# Patient Record
Sex: Male | Born: 1990 | Race: White | Hispanic: No | Marital: Single | Smoking: Former smoker
Health system: Southern US, Community
[De-identification: ages and names within clinical notes are randomized; demographics above are authoritative.]

## PROBLEM LIST (undated history)

## (undated) DIAGNOSIS — F419 Anxiety disorder, unspecified: Secondary | ICD-10-CM

## (undated) DIAGNOSIS — F909 Attention-deficit hyperactivity disorder, unspecified type: Secondary | ICD-10-CM

## (undated) DIAGNOSIS — U071 COVID-19: Secondary | ICD-10-CM

---

## 2010-07-21 ENCOUNTER — Telehealth: Payer: Self-pay | Admitting: Family Medicine

## 2010-07-21 ENCOUNTER — Ambulatory Visit: Payer: Self-pay | Admitting: Family Medicine

## 2010-07-21 NOTE — Telephone Encounter (Signed)
Pt called in and needs his alapralozam 1 mg he takes twice daily.  However, pt had a NP appt today with Dr. Cathey Endow but she is out of the office sick.  Pt has to reschedule his new patient appt.  Told pt his only option to get his alprazolam med would be to go to the ED and hopefully they could give him a couple of pills til he gets back in with Dr. Cathey Endow.  Pt voiced understanding. Daniel Newcomer, LPN [facility]

## 2019-04-01 ENCOUNTER — Emergency Department
Admission: EM | Admit: 2019-04-01 | Discharge: 2019-04-01 | Disposition: A | Discharge status: Home / Self Care | Payer: Self-pay | Source: Home / Self Care | Attending: Family Medicine | Admitting: Family Medicine

## 2019-04-01 ENCOUNTER — Other Ambulatory Visit: Payer: Self-pay

## 2019-04-01 DIAGNOSIS — H5713 Ocular pain, bilateral: Secondary | ICD-10-CM

## 2019-04-01 NOTE — ED Provider Notes (Signed)
[name]    CSN: [id number] Arrival date & time: 04/01/19  1744      History   Chief Complaint Chief Complaint  Patient presents with  . Eye Problem    HPI Daniel Goodwin is a 29 y.o. male.   Patient complains of 4 to 5 day history of recurrent episodes of throbbing pain behind his right eye without changes in vision.  The episodes usually last about 20 to 30 minutes and resolve spontaneously.  All episodes have been in his right eye, but while driving to our clinic he developed similar pain in his left eye, now resolved.  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   He does not wear glasses.  The history is provided by the patient.  Eye Problem Location:  Both eyes Quality:  Pulsating Severity:  Mild Onset quality:  Gradual Duration:  5 days Timing:  Sporadic Progression:  Unchanged Chronicity:  New Context: not contact lens problem, not direct trauma, not foreign body, not using machinery, not scratch, not smoke exposure and not UV exposure   Relieved by:  Nothing Worsened by:  Nothing Ineffective treatments:  None tried Associated symptoms: no blurred vision, no crusting, no decreased vision, no discharge, no double vision, no facial rash, no headaches, no inflammation, no photophobia, no redness, no scotomas, no swelling and no tearing   Risk factors: no recent URI     History reviewed. No pertinent past medical history.  There are no problems to display for this patient.   History reviewed. No pertinent surgical history.     Home Medications    Prior to Admission medications   Medication Sig Start Date End Date Taking? Authorizing Provider  ALPRAZolam Duanne Moron) 1 MG tablet Take by mouth. 07/14/14  Yes [provider]  amphetamine-dextroamphetamine (ADDERALL XR) 10 MG 24 hr capsule Take by mouth.    [provider]    Family History History reviewed. No pertinent family history.  Social  History Social History   Tobacco Use  . Smoking status: Former Research scientist (life sciences)  . Smokeless tobacco: Never Used  Substance Use Topics  . Alcohol use: Not Currently  . Drug use: Not on file     Allergies   Penicillins   Review of Systems Review of Systems  Eyes: Negative for blurred vision, double vision, photophobia, discharge and redness.  Neurological: Negative for headaches.  All other systems reviewed and are negative.    Physical Exam Triage Vital Signs ED Triage Vitals [04/01/19 1807]  Enc Vitals Group     BP (!) 149/86     Pulse Rate 88     Resp 18     Temp 98.1 F (36.7 C)     Temp Source Oral     SpO2 96 %     Weight 208 lb (94.3 kg)     Height 6\' 3"  (1.905 m)     Head Circumference      Peak Flow      Pain Score 0     Pain Loc      Pain Edu?      Excl. in [place]?    No data found.  Updated Vital Signs BP (!) 149/86 (BP Location: Right Arm)   Pulse 88   Temp 98.1 F (36.7 C) (Oral)   Resp 18   Ht 6\' 3"  (1.905 m)   Wt 94.3 kg   SpO2 96%   BMI 26.00 kg/m  Visual Acuity Right Eye Distance:   Left Eye Distance:   Bilateral Distance:    Right Eye Near:   Left Eye Near:    Bilateral Near:     Physical Exam Vitals and nursing note reviewed.  Constitutional:      General: He is not in acute distress.    Appearance: Normal appearance.  HENT:     Head: Normocephalic.     Right Ear: Tympanic membrane, ear canal and external ear normal.     Left Ear: Tympanic membrane, ear canal and external ear normal.     Nose: Nose normal.     Mouth/Throat:     Mouth: Mucous membranes are moist.     Pharynx: Oropharynx is clear.  Eyes:     General: Lids are normal. Gaze aligned appropriately.        Right eye: No discharge or hordeolum.        Left eye: No discharge or hordeolum.     Extraocular Movements: Extraocular movements intact.     Conjunctiva/sclera: Conjunctivae normal.     Pupils: Pupils are equal, round, and reactive to light.     Comments: Fundi  benign.  Cardiovascular:     Rate and Rhythm: Normal rate.  Pulmonary:     Effort: Pulmonary effort is normal.  Lymphadenopathy:     Cervical: No cervical adenopathy.  Skin:    General: Skin is warm and dry.  Neurological:     Mental Status: He is alert.      UC Treatments / Results  Labs (all labs ordered are listed, but only abnormal results are displayed) Labs Reviewed - No data to display  EKG   Radiology No results found.  Procedures Procedures (including critical care time)  Medications Ordered in UC Medications - No data to display  Initial Impression / Assessment and Plan / UC Course  I have reviewed the triage vital signs and the nursing notes.  Pertinent labs & imaging results that were available during my care of the patient were reviewed by me and considered in my medical decision making (see chart for details).    Normal eye exam reassuring.  Cause of patient's pain is not apparent. Recommend follow-up with ophthalmologist for complete eye exam   Final Clinical Impressions(s) / UC Diagnoses   Final diagnoses:  Eye pain, bilateral   Discharge Instructions   None    ED Prescriptions    None        Lattie Haw, MD 04/02/19 1050

## 2019-04-01 NOTE — ED Triage Notes (Signed)
Pt c/o for the last 4-5 days, intermittent episodes of a throbbing pain in his RT eye. Lasts about 20-30 at a time, occurring about 3-4x a day.

## 2020-01-07 ENCOUNTER — Encounter: Payer: Self-pay | Admitting: Emergency Medicine

## 2020-01-07 ENCOUNTER — Emergency Department: Admit: 2020-01-07 | Discharge status: Absent without leave | Payer: Self-pay

## 2020-01-07 ENCOUNTER — Emergency Department
Admission: EM | Admit: 2020-01-07 | Discharge: 2020-01-07 | Disposition: A | Discharge status: Home / Self Care | Payer: BC Managed Care – PPO | Source: Home / Self Care | Attending: Internal Medicine | Admitting: Internal Medicine

## 2020-01-07 ENCOUNTER — Other Ambulatory Visit: Payer: Self-pay

## 2020-01-07 ENCOUNTER — Emergency Department (INDEPENDENT_AMBULATORY_CARE_PROVIDER_SITE_OTHER): Payer: BC Managed Care – PPO

## 2020-01-07 DIAGNOSIS — M79675 Pain in left toe(s): Secondary | ICD-10-CM | POA: Diagnosis not present

## 2020-01-07 DIAGNOSIS — S93505A Unspecified sprain of left lesser toe(s), initial encounter: Secondary | ICD-10-CM | POA: Diagnosis not present

## 2020-01-07 MED ORDER — IBUPROFEN 600 MG PO TABS: 600.0000 mg | ORAL_TABLET | Freq: Four times a day (QID) | ORAL | 0 refills | Status: DC | PRN

## 2020-01-07 NOTE — Discharge Instructions (Signed)
Rest, elevation, icing and gentle range of motion exercises Return to urgent care if symptoms worsen.

## 2020-01-07 NOTE — ED Triage Notes (Signed)
Left 4th toe injury running last night and ran into brick wall, immediate swelling and severe pain

## 2020-01-07 NOTE — ED Provider Notes (Signed)
[name]    CSN: [id number] Arrival date & time: 01/07/20  1002      History   Chief Complaint Chief Complaint  Patient presents with  . Toe Injury    HPI Daniel Goodwin is a 29 y.o. male comes to the urgent care with 1 day history of left fourth toe pain.  Pain is severe, throbbing, aggravated by putting weight on the left foot and no relieving factors.  Is associated with swelling and bruising of the fourth left toe.  Patient was running to get something for his child and accidentally hit her foot against the brick wall around the fireplace.  HPI  No past medical history on file.  There are no problems to display for this patient.   No past surgical history on file.     Home Medications    Prior to Admission medications   Medication Sig Start Date End Date Taking? Authorizing Provider  ALPRAZolam Prudy Feeler) 1 MG tablet Take by mouth. 07/14/14   [provider]  amphetamine-dextroamphetamine (ADDERALL XR) 10 MG 24 hr capsule Take by mouth.    [provider]  ibuprofen (ADVIL) 600 MG tablet Take 1 tablet (600 mg total) by mouth every 6 (six) hours as needed. 01/07/20   LampteyBritta Mccreedy, MD    Family History Family History  Problem Relation Age of Onset  . Cancer Father     Social History Social History   Tobacco Use  . Smoking status: Former Games developer  . Smokeless tobacco: Never Used  Vaping Use  . Vaping Use: Every day  . Devices: Vaping for 4-5 years  Substance Use Topics  . Alcohol use: Not Currently     Allergies   Penicillins   Review of Systems Review of Systems  Musculoskeletal: Positive for arthralgias and joint swelling.  Skin: Positive for color change. Negative for pallor and rash.     Physical Exam Triage Vital Signs ED Triage Vitals  Enc Vitals Group     BP 01/07/20 1027 118/81     Pulse Rate 01/07/20 1027 89     Resp 01/07/20 1027 18     Temp 01/07/20 1027 98.2 F (36.8 C)     Temp Source  01/07/20 1027 Oral     SpO2 01/07/20 1027 99 %     Weight 01/07/20 1029 220 lb (99.8 kg)     Height 01/07/20 1029 6\' 3"  (1.905 m)     Head Circumference --      Peak Flow --      Pain Score 01/07/20 1028 7     Pain Loc --      Pain Edu? --      Excl. in [place]? --    No data found.  Updated Vital Signs BP 118/81 (BP Location: Right Arm)   Pulse 89   Temp 98.2 F (36.8 C) (Oral)   Resp 18   Ht 6\' 3"  (1.905 m)   Wt 99.8 kg   SpO2 99%   BMI 27.50 kg/m   Visual Acuity Right Eye Distance:   Left Eye Distance:   Bilateral Distance:    Right Eye Near:   Left Eye Near:    Bilateral Near:     Physical Exam Vitals and nursing note reviewed.  Musculoskeletal:        General: Swelling, tenderness and deformity present.  Skin:    General: Skin is warm.     Coloration: Skin is not pale.     Findings:  Bruising present. No erythema.      UC Treatments / Results  Labs (all labs ordered are listed, but only abnormal results are displayed) Labs Reviewed - No data to display  EKG   Radiology DG Toe 4th Left  Result Date: 01/07/2020 CLINICAL DATA:  Pain after hitting toe against solid object EXAM: LEFT FOURTH TOE: 3 V COMPARISON:  None. FINDINGS: Frontal, oblique, and lateral views were obtained. There is no fracture or dislocation. Joint spaces appear normal. No erosive change. IMPRESSION: No fracture or dislocation.  No appreciable arthropathy. Electronically Signed   By: Bretta Bang III M.D.   On: 01/07/2020 11:11    Procedures Procedures (including critical care time)  Medications Ordered in UC Medications - No data to display  Initial Impression / Assessment and Plan / UC Course  I have reviewed the triage vital signs and the nursing notes.  Pertinent labs & imaging results that were available during my care of the patient were reviewed by me and considered in my medical decision making (see chart for details).     1.  Left fourth toe pain: X-ray of the  left foot is negative for acute fracture in the fourth toe of the left foot Ibuprofen 600 mg every 6 hours as needed for pain Rest, elevation, gentle range of motion exercises Return precautions given. Final Clinical Impressions(s) / UC Diagnoses   Final diagnoses:  Sprain of fourth toe of left foot, initial encounter     Discharge Instructions     Rest, elevation, icing and gentle range of motion exercises Return to urgent care if symptoms worsen.    ED Prescriptions    Medication Sig Dispense Auth. Provider   ibuprofen (ADVIL) 600 MG tablet Take 1 tablet (600 mg total) by mouth every 6 (six) hours as needed. 30 tablet Lennix Kneisel, Britta Mccreedy, MD     PDMP not reviewed this encounter.   Merrilee Jansky, MD 01/07/20 1114

## 2020-01-09 ENCOUNTER — Ambulatory Visit: Payer: BC Managed Care – PPO | Admitting: Family Medicine

## 2020-02-22 ENCOUNTER — Emergency Department
Admission: EM | Admit: 2020-02-22 | Discharge: 2020-02-22 | Disposition: A | Discharge status: Home / Self Care | Payer: BC Managed Care – PPO | Source: Home / Self Care

## 2020-02-22 ENCOUNTER — Other Ambulatory Visit: Payer: Self-pay

## 2020-02-22 DIAGNOSIS — J069 Acute upper respiratory infection, unspecified: Secondary | ICD-10-CM

## 2020-02-22 DIAGNOSIS — U071 COVID-19: Secondary | ICD-10-CM

## 2020-02-22 HISTORY — DX: Attention-deficit hyperactivity disorder, unspecified type: F90.9

## 2020-02-22 HISTORY — DX: Anxiety disorder, unspecified: F41.9

## 2020-02-22 HISTORY — DX: COVID-19: U07.1

## 2020-02-22 MED ORDER — IPRATROPIUM BROMIDE 0.03 % NA SOLN: 2.0000 | Freq: Three times a day (TID) | NASAL | 0 refills | Status: DC | PRN

## 2020-02-22 NOTE — ED Provider Notes (Signed)
[facility]    CSN: [id number] Arrival date & time: 02/22/20  1024      History   Chief Complaint Chief Complaint  Patient presents with  . Nasal Congestion    COVID positive    HPI Daniel Goodwin is a 30 y.o. male.   HPI Patient presents today for evaluation of nasal symptoms associated with a recent positive COVID-19 diagnosis x3 days ago.  Patient has a positive [facility] on 02/19/2020 for COVID-19.  Since that time he has had a common course of illness consisting of nasal congestion and sore throat.  Body aches and GI symptoms resolved following 24 hours after onset of symptoms.  He denies any shortness of breath. Past Medical History:  Diagnosis Date  . ADHD   . Anxiety   . COVID     There are no problems to display for this patient.   History reviewed. No pertinent surgical history.     Home Medications    Prior to Admission medications   Medication Sig Start Date End Date Taking? Authorizing Provider  acetaminophen (TYLENOL) 325 MG tablet Take 650 mg by mouth every 6 (six) hours as needed.   Yes [provider]  ALPRAZolam Prudy Feeler) 1 MG tablet Take by mouth. 07/14/14   [provider]  amphetamine-dextroamphetamine (ADDERALL XR) 10 MG 24 hr capsule Take by mouth.    [provider]  ibuprofen (ADVIL) 600 MG tablet Take 1 tablet (600 mg total) by mouth every 6 (six) hours as needed. 01/07/20   LampteyBritta Mccreedy, MD    Family History Family History  Problem Relation Age of Onset  . Healthy Mother   . Cancer Father   . Heart attack Father     Social History Social History   Tobacco Use  . Smoking status: Former Games developer  . Smokeless tobacco: Never Used  Vaping Use  . Vaping Use: Every day  . Devices: Vaping for 4-5 years  Substance Use Topics  . Alcohol use: Not Currently     Allergies   Penicillins   Review of Systems Review of Systems Pertinent negatives listed in HPI  Physical  Exam Triage Vital Signs ED Triage Vitals  Enc Vitals Group     BP 02/22/20 1049 (!) 142/84     Pulse Rate 02/22/20 1049 84     Resp 02/22/20 1049 18     Temp 02/22/20 1049 99.1 F (37.3 C)     Temp Source 02/22/20 1049 Oral     SpO2 02/22/20 1049 98 %     Weight 02/22/20 1044 220 lb (99.8 kg)     Height 02/22/20 1044 6\' 3"  (1.905 m)     Head Circumference --      Peak Flow --      Pain Score 02/22/20 1043 6     Pain Loc --      Pain Edu? --      Excl. in [place]? --    No data found.  Updated Vital Signs BP (!) 142/84 (BP Location: Right Arm)   Pulse 84   Temp 99.1 F (37.3 C) (Oral)   Resp 18   Ht 6\' 3"  (1.905 m)   Wt 220 lb (99.8 kg)   SpO2 98%   BMI 27.50 kg/m   Visual Acuity Right Eye Distance:   Left Eye Distance:   Bilateral Distance:    Right Eye Near:   Left Eye Near:    Bilateral Near:  Physical Exam  General Appearance:    Alert, acutely ill appearing, cooperative, no distress  HENT:   Normocephalic, ears normal, nares mucosal edema with congestion, rhinorrhea, oropharynx    Eyes:    PERRL, conjunctiva/corneas clear, EOM's intact       Lungs:     Clear to auscultation bilaterally, respirations unlabored  Heart:    Regular rate and rhythm  Neurologic:   Awake, alert, oriented x 3. No apparent focal neurological           defect.     UC Treatments / Results  Labs (all labs ordered are listed, but only abnormal results are displayed) Labs Reviewed - No data to display  EKG   Radiology No results found.  Procedures Procedures (including critical care time)  Medications Ordered in UC Medications - No data to display  Initial Impression / Assessment and Plan / UC Course  I have reviewed the triage vital signs and the nursing notes.  Pertinent labs & imaging results that were available during my care of the patient were reviewed by me and considered in my medical decision making (see chart for details).    Patient presents today with an  active COVID-19 infection with mild symptoms.  Reinforced the importance of symptomatic management only as symptoms are viral and will resolve with management of symptoms.  For nasal symptoms recommend antihistamine therapy along with prescribed Atrovent.  ER precautions given if patient develops any difficulty breathing or severe chest pain Final Clinical Impressions(s) / UC Diagnoses   Final diagnoses:  COVID-19 virus infection  Viral URI     Discharge Instructions     You are suffering from symptoms consistent with COVID-19 viral infection therefore antibiotics are not warranted for treatment and management of symptoms.  Symptom management is warranted recommend over-the-counter antihistamine such as Zyrtec or Xyzal    ED Prescriptions    Medication Sig Dispense Auth. Provider   ipratropium (ATROVENT) 0.03 % nasal spray Place 2 sprays into both nostrils 3 (three) times daily as needed for rhinitis. 30 mL Bing Neighbors, FNP     PDMP not reviewed this encounter.   Bing Neighbors, FNP 02/22/20 1344

## 2020-02-22 NOTE — ED Triage Notes (Addendum)
Pt presents to Urgent Care with c/o sore throat and nasal congestion w/ occasional blood in mucus x 4-5 days. Reports testing positive for COVID 3-4 days ago; fully vaccinated. Pt states he is concerned that he may have a sinus infection.

## 2020-02-22 NOTE — Discharge Instructions (Signed)
You are suffering from symptoms consistent with COVID-19 viral infection therefore antibiotics are not warranted for treatment and management of symptoms.  Symptom management is warranted recommend over-the-counter antihistamine such as Zyrtec or Xyzal

## 2020-02-26 ENCOUNTER — Telehealth: Payer: Self-pay | Admitting: Emergency Medicine

## 2020-02-26 NOTE — Telephone Encounter (Signed)
Patient called asking for medication for Covid. I explained its a virus and there is no medicine, to continue with Naproxen, warm salt water gargles, chloreseptic, throat lozenges, Mucinex, sudafed.

## 2020-08-15 ENCOUNTER — Emergency Department (INDEPENDENT_AMBULATORY_CARE_PROVIDER_SITE_OTHER)
Admission: EM | Admit: 2020-08-15 | Discharge: 2020-08-15 | Disposition: A | Discharge status: Home / Self Care | Payer: BC Managed Care – PPO | Source: Home / Self Care

## 2020-08-15 ENCOUNTER — Encounter: Payer: Self-pay | Admitting: Emergency Medicine

## 2020-08-15 ENCOUNTER — Other Ambulatory Visit: Payer: Self-pay

## 2020-08-15 DIAGNOSIS — J029 Acute pharyngitis, unspecified: Secondary | ICD-10-CM | POA: Diagnosis not present

## 2020-08-15 LAB — POCT RAPID STREP A (OFFICE): Rapid Strep A Screen: NEGATIVE

## 2020-08-15 MED ORDER — ALUM & MAG HYDROXIDE-SIMETH 200-200-20 MG/5ML PO SUSP
15.0000 mL | Freq: Once | ORAL | Status: AC
  Administered 2020-08-15: 15 mL via ORAL

## 2020-08-15 MED ORDER — SODIUM CHLORIDE 0.9 % IV SOLN: 40.0000 mg | Freq: Once | INTRAVENOUS | Status: DC

## 2020-08-15 MED ORDER — LIDOCAINE VISCOUS HCL 2 % MT SOLN
15.0000 mL | Freq: Once | OROMUCOSAL | Status: AC
  Administered 2020-08-15: 15 mL via OROMUCOSAL

## 2020-08-15 MED ORDER — FAMOTIDINE 40 MG PO TABS
40.0000 mg | ORAL_TABLET | Freq: Two times a day (BID) | ORAL | Status: DC
  Administered 2020-08-15: 40 mg via ORAL

## 2020-08-15 MED ORDER — FAMOTIDINE 20 MG PO TABS: 20.0000 mg | ORAL_TABLET | Freq: Two times a day (BID) | ORAL | 0 refills | Status: DC

## 2020-08-15 NOTE — Discharge Instructions (Addendum)
Take Pepcid twice daily.

## 2020-08-15 NOTE — ED Triage Notes (Signed)
Sore throat since Wednesday  Negative COVID test on Thursday at home  OTC tylenol 500mg  at 0940- min relief Feels like there is a stabbing sensation when he swallows  COVID in Jan or Feb 2022 COVID vaccine x 2

## 2020-08-15 NOTE — ED Provider Notes (Signed)
[facility]  ____________________________________________  Time seen: Approximately 11:55 AM  I have reviewed the triage vital signs and the nursing notes.   HISTORY  Chief Complaint Sore Throat and Gastroesophageal Reflux   Historian {Patient     HPI Daniel Goodwin is a 30 y.o. male resents to the urgent care with pharyngitis for the past 4 days.  Patient denies headache, rhinorrhea, nasal congestion or fever.  His at-home COVID-19 testing was negative.  No chest pain, chest tightness or abdominal pain.  Patient does have a history of acid reflux and is not currently taking any antacid medications.   Past Medical History:  Diagnosis Date   ADHD    Anxiety    COVID      Immunizations up to date:  Yes.     Past Medical History:  Diagnosis Date   ADHD    Anxiety    COVID     There are no problems to display for this patient.   History reviewed. No pertinent surgical history.  Prior to Admission medications   Medication Sig Start Date End Date Taking? Authorizing Provider  famotidine (PEPCID) 20 MG tablet Take 1 tablet (20 mg total) by mouth 2 (two) times daily. 08/15/20  Yes Pia Mau M, PA-C  acetaminophen (TYLENOL) 325 MG tablet Take 650 mg by mouth every 6 (six) hours as needed.    [provider]  ALPRAZolam Prudy Feeler) 1 MG tablet Take by mouth. 07/14/14   [provider]  amphetamine-dextroamphetamine (ADDERALL XR) 10 MG 24 hr capsule Take by mouth.    [provider]  ibuprofen (ADVIL) 600 MG tablet Take 1 tablet (600 mg total) by mouth every 6 (six) hours as needed. Patient not taking: Reported on 08/15/2020 01/07/20   Merrilee Jansky, MD  ipratropium (ATROVENT) 0.03 % nasal spray Place 2 sprays into both nostrils 3 (three) times daily as needed for rhinitis. Patient not taking: Reported on 08/15/2020 02/22/20   Bing Neighbors, FNP    Allergies Penicillins  Family History  Problem Relation Age of Onset    Healthy Mother    Cancer Father    Heart attack Father     Social History Social History   Tobacco Use   Smoking status: Former   Smokeless tobacco: Never  Building services engineer Use: Every day   Devices: Vaping for 4-5 years  Substance Use Topics   Alcohol use: Not Currently     Review of Systems  Constitutional: No fever/chills Eyes:  No discharge ENT: Patient has pharyngitis.  Respiratory: no cough. No SOB/ use of accessory muscles to breath Gastrointestinal:   No nausea, no vomiting.  No diarrhea.  No constipation. Musculoskeletal: Negative for musculoskeletal pain. Skin: Negative for rash, abrasions, lacerations, ecchymosis.    ____________________________________________   PHYSICAL EXAM:  VITAL SIGNS: ED Triage Vitals  Enc Vitals Group     BP 08/15/20 1141 127/83     Pulse Rate 08/15/20 1141 80     Resp 08/15/20 1141 16     Temp 08/15/20 1141 98.3 F (36.8 C)     Temp Source 08/15/20 1141 Oral     SpO2 08/15/20 1141 98 %     Weight --      Height --      Head Circumference --      Peak Flow --      Pain Score 08/15/20 1144 7     Pain Loc --      Pain Edu? --  Excl. in [place]? --      Constitutional: Alert and oriented. Well appearing and in no acute distress. Eyes: Conjunctivae are normal. PERRL. EOMI. Head: Atraumatic. ENT:      Ears:       Nose: No congestion/rhinnorhea.      Mouth/Throat: Mucous membranes are moist.  Posterior pharynx is mildly erythematous. Neck: No stridor.  No cervical spine tenderness to palpation. Cardiovascular: Normal rate, regular rhythm. Normal S1 and S2.  Good peripheral circulation. Respiratory: Normal respiratory effort without tachypnea or retractions. Lungs CTAB. Good air entry to the bases with no decreased or absent breath sounds Gastrointestinal: Bowel sounds x 4 quadrants. Soft and nontender to palpation. No guarding or rigidity. No distention. Musculoskeletal: Full range of motion to all extremities. No  obvious deformities noted Neurologic:  Normal for age. No gross focal neurologic deficits are appreciated.  Skin:  Skin is warm, dry and intact. No rash noted. Psychiatric: Mood and affect are normal for age. Speech and behavior are normal.   ____________________________________________   LABS (all labs ordered are listed, but only abnormal results are displayed)  Labs Reviewed  CULTURE, GROUP A STREP  POCT RAPID STREP A (OFFICE)   ____________________________________________  EKG   ____________________________________________  RADIOLOGY   No results found.  ____________________________________________    PROCEDURES  Procedure(s) performed:     Procedures     Medications  famotidine (PEPCID) tablet 40 mg (40 mg Oral Given 08/15/20 1157)  alum & mag hydroxide-simeth (MAALOX/MYLANTA) 200-200-20 MG/5ML suspension 15 mL (15 mLs Oral Given 08/15/20 1158)  lidocaine (XYLOCAINE) 2 % viscous mouth solution 15 mL (15 mLs Mouth/Throat Given 08/15/20 1158)     ____________________________________________   INITIAL IMPRESSION / ASSESSMENT AND PLAN / ED COURSE  Pertinent labs & imaging results that were available during my care of the patient were reviewed by me and considered in my medical decision making (see chart for details).    Assessment and plan Pharyngitis 30 year old male presents to the urgent care with pharyngitis for the past 4 days.  Vital signs are reassuring at triage.  On physical exam, patient had tonsillar atrophy with some cobblestoning.  No tonsillar hypertrophy or exudate.  Patient tested negative for group A strep.  Strep culture is in process.  Patient was given GI cocktail and Pepcid as I suspect a component of acid reflux.  Patient was advised to continue taking Pepcid daily.  Also recommended Tylenol for pharyngitis.  Return precautions were given to return with new or worsening  symptoms.      ____________________________________________  FINAL CLINICAL IMPRESSION(S) / ED DIAGNOSES  Final diagnoses:  Pharyngitis, unspecified etiology  Acute pharyngitis, unspecified etiology      NEW MEDICATIONS STARTED DURING THIS VISIT:  ED Discharge Orders          Ordered    famotidine (PEPCID) 20 MG tablet  2 times daily        08/15/20 1206                This chart was dictated using voice recognition software/Dragon. Despite best efforts to proofread, errors can occur which can change the meaning. Any change was purely unintentional.     Orvil Feil, PA-C 08/15/20 1402

## 2020-08-19 LAB — CULTURE, GROUP A STREP: Strep A Culture: NEGATIVE

## 2020-09-04 ENCOUNTER — Emergency Department: Admit: 2020-09-04 | Discharge status: Absent without leave | Payer: Self-pay

## 2020-09-04 ENCOUNTER — Emergency Department
Admission: RE | Admit: 2020-09-04 | Discharge: 2020-09-04 | Disposition: A | Discharge status: Home / Self Care | Payer: BC Managed Care – PPO | Source: Ambulatory Visit

## 2020-09-04 ENCOUNTER — Other Ambulatory Visit: Payer: Self-pay

## 2020-09-04 VITALS — BP 124/83 | HR 78 | Temp 99.2°F | Resp 16 | Ht 75.0 in | Wt 220.0 lb

## 2020-09-04 DIAGNOSIS — S93401A Sprain of unspecified ligament of right ankle, initial encounter: Secondary | ICD-10-CM

## 2020-09-04 NOTE — Discharge Instructions (Addendum)
Instructed patient to RICE right ankle for 20 minutes 2-3 times daily for the next 3 days.  Advised patient may take OTC ibuprofen 600 mg 1-2 times daily, as needed.  Advised/encouraged patient to rest right ankle for the next 5 to 7 days before repeating walking exercises.

## 2020-09-04 NOTE — ED Triage Notes (Signed)
Pain & swelling to R ankle x 1 week  Per pt he is normally sedentary  Pt is concerned about an injury -pain goes from ankle to inside of right lower leg Pt states he went for a 4 mile walk prior to pain starting Trace edema noted to bilat ankles Pt has iced & elevated R ankle  No new meds  Tylenol OTC

## 2020-09-04 NOTE — ED Provider Notes (Signed)
[facility]    CSN: [id number] Arrival date & time: 09/04/20  1341      History   Chief Complaint Chief Complaint  Patient presents with   Ankle Pain    Right     HPI Daniel Goodwin is a 30 y.o. male.   HPI 30 year old male presents with right ankle pain and swelling for 1 week.  Reports he is normally sedentary; however, recently went for a 4 mile walk afterwards noting trace edema and right ankle pain.  Past Medical History:  Diagnosis Date   ADHD    Anxiety    COVID     There are no problems to display for this patient.   History reviewed. No pertinent surgical history.     Home Medications    Prior to Admission medications   Medication Sig Start Date End Date Taking? Authorizing Provider  acetaminophen (TYLENOL) 325 MG tablet Take 650 mg by mouth every 6 (six) hours as needed.    [provider]  ALPRAZolam Prudy Feeler) 1 MG tablet Take by mouth. 07/14/14   [provider]  amphetamine-dextroamphetamine (ADDERALL XR) 10 MG 24 hr capsule Take by mouth.    [provider]  famotidine (PEPCID) 20 MG tablet Take 1 tablet (20 mg total) by mouth 2 (two) times daily. 08/15/20   Orvil Feil, PA-C  ibuprofen (ADVIL) 600 MG tablet Take 1 tablet (600 mg total) by mouth every 6 (six) hours as needed. Patient not taking: Reported on 08/15/2020 01/07/20   Merrilee Jansky, MD  ipratropium (ATROVENT) 0.03 % nasal spray Place 2 sprays into both nostrils 3 (three) times daily as needed for rhinitis. Patient not taking: Reported on 08/15/2020 02/22/20   Bing Neighbors, FNP    Family History Family History  Problem Relation Age of Onset   Healthy Mother    Cancer Father    Heart attack Father     Social History Social History   Tobacco Use   Smoking status: Former   Smokeless tobacco: Never  Building services engineer Use: Every day   Substances: Nicotine   Devices: Vaping for 4-5 years  Substance Use Topics   Alcohol use: Not  Currently   Drug use: Not Currently     Allergies   Penicillins   Review of Systems Review of Systems  Musculoskeletal:        Right ankle pain x1 week    Physical Exam Triage Vital Signs ED Triage Vitals  Enc Vitals Group     BP 09/04/20 1417 124/83     Pulse Rate 09/04/20 1417 78     Resp 09/04/20 1417 16     Temp 09/04/20 1417 99.2 F (37.3 C)     Temp Source 09/04/20 1417 Oral     SpO2 09/04/20 1417 98 %     Weight 09/04/20 1419 220 lb 0.3 oz (99.8 kg)     Height 09/04/20 1419 6\' 3"  (1.905 m)     Head Circumference --      Peak Flow --      Pain Score 09/04/20 1418 1     Pain Loc --      Pain Edu? --      Excl. in GC? --    No data found.  Updated Vital Signs BP 124/83 (BP Location: Right Arm)   Pulse 78   Temp 99.2 F (37.3 C) (Oral)   Resp 16   Ht 6\' 3"  (1.905 m)   Wt  220 lb 0.3 oz (99.8 kg)   SpO2 98%   BMI 27.50 kg/m   Physical Exam Vitals and nursing note reviewed.  Constitutional:      General: He is not in acute distress.    Appearance: Normal appearance. He is normal weight. He is not ill-appearing.  HENT:     Head: Normocephalic and atraumatic.     Mouth/Throat:     Mouth: Mucous membranes are moist.     Pharynx: Oropharynx is clear.  Eyes:     Extraocular Movements: Extraocular movements intact.     Conjunctiva/sclera: Conjunctivae normal.     Pupils: Pupils are equal, round, and reactive to light.  Cardiovascular:     Rate and Rhythm: Normal rate and regular rhythm.     Pulses: Normal pulses.     Heart sounds: Normal heart sounds.  Pulmonary:     Effort: Pulmonary effort is normal.     Breath sounds: Normal breath sounds. No wheezing, rhonchi or rales.  Musculoskeletal:        General: No swelling, tenderness or deformity. Normal range of motion.     Cervical back: Normal range of motion and neck supple. No tenderness.     Comments: Right ankle: Non-TTP over medial/lateral malleolus, FROM flexion/extension, performing ankle  circles, no deformity noted  Lymphadenopathy:     Cervical: No cervical adenopathy.  Skin:    General: Skin is warm and dry.     Findings: No bruising, erythema, lesion or rash.  Neurological:     General: No focal deficit present.     Mental Status: He is alert and oriented to person, place, and time. Mental status is at baseline.  Psychiatric:        Mood and Affect: Mood normal.        Behavior: Behavior normal.     UC Treatments / Results  Labs (all labs ordered are listed, but only abnormal results are displayed) Labs Reviewed - No data to display  EKG   Radiology No results found.  Procedures Procedures (including critical care time)  Medications Ordered in UC Medications - No data to display  Initial Impression / Assessment and Plan / UC Course  I have reviewed the triage vital signs and the nursing notes.  Pertinent labs & imaging results that were available during my care of the patient were reviewed by me and considered in my medical decision making (see chart for details).     MDM: 1.  Sprain of right ankle, unspecified ligament initial encounter-nstructed patient to RICE right ankle for 20 minutes 2-3 times daily for the next 3 days.  Advised patient may take OTC Ibuprofen 600 mg 1-2 times daily, as needed.  Advised/encouraged patient to rest right ankle for the next 5 to 7 days before repeating walking exercises. Final Clinical Impressions(s) / UC Diagnoses   Final diagnoses:  Sprain of right ankle, unspecified ligament, initial encounter     Discharge Instructions      Instructed patient to RICE right ankle for 20 minutes 2-3 times daily for the next 3 days.  Advised patient may take OTC ibuprofen 600 mg 1-2 times daily, as needed.  Advised/encouraged patient to rest right ankle for the next 5 to 7 days before repeating walking exercises.     ED Prescriptions   None    PDMP not reviewed this encounter.   Trevor Iha, FNP 09/04/20 1516

## 2020-09-12 ENCOUNTER — Encounter: Payer: Self-pay | Admitting: Emergency Medicine

## 2020-09-12 ENCOUNTER — Emergency Department (INDEPENDENT_AMBULATORY_CARE_PROVIDER_SITE_OTHER)
Admission: EM | Admit: 2020-09-12 | Discharge: 2020-09-12 | Disposition: A | Discharge status: Home / Self Care | Payer: BC Managed Care – PPO | Source: Home / Self Care | Attending: Family Medicine | Admitting: Family Medicine

## 2020-09-12 ENCOUNTER — Other Ambulatory Visit: Payer: Self-pay

## 2020-09-12 DIAGNOSIS — H5789 Other specified disorders of eye and adnexa: Secondary | ICD-10-CM

## 2020-09-12 MED ORDER — PREDNISONE 20 MG PO TABS: 20.0000 mg | ORAL_TABLET | Freq: Two times a day (BID) | ORAL | 0 refills | Status: DC

## 2020-09-12 NOTE — ED Triage Notes (Signed)
Patient states that he awoke this morning after doing yard work yesterday to the bottom of his right eye swollen.  No visual disturbance, no itching, some stinging right up under eye.  Patient denies any OTC meds.

## 2020-09-12 NOTE — Discharge Instructions (Addendum)
Use cool compresses to eye Take antihistamines like Claritin or Zyrtec, may take Benadryl at night Take the prednisone 2 times a day, take 2 doses today.  Take prednisone until reaction clears, likely 2 to 3 days Return as needed

## 2020-09-13 NOTE — ED Provider Notes (Signed)
[facility]    CSN: [id number] Arrival date & time: 09/12/20  8676      History   Chief Complaint Chief Complaint  Patient presents with   Eye Problem    HPI Kalum Minner is a 30 y.o. male.   HPI  Patient states he did some yard work yesterday.  He does not remember anything flying into his right eye, or any irritation of his eye.  Woke up with swelling and redness.  It is itching.  No tearing or discharge.  No redness of the conjunctive a.  No trouble with vision  Past Medical History:  Diagnosis Date   ADHD    Anxiety    COVID     There are no problems to display for this patient.   History reviewed. No pertinent surgical history.     Home Medications    Prior to Admission medications   Medication Sig Start Date End Date Taking? Authorizing Provider  acetaminophen (TYLENOL) 325 MG tablet Take 650 mg by mouth every 6 (six) hours as needed.   Yes [provider]  ALPRAZolam Prudy Feeler) 1 MG tablet Take by mouth. 07/14/14  Yes [provider]  amphetamine-dextroamphetamine (ADDERALL XR) 10 MG 24 hr capsule Take by mouth.   Yes [provider]  famotidine (PEPCID) 20 MG tablet Take 1 tablet (20 mg total) by mouth 2 (two) times daily. 08/15/20  Yes Pia Mau M, PA-C  predniSONE (DELTASONE) 20 MG tablet Take 1 tablet (20 mg total) by mouth 2 (two) times daily with a meal. 09/12/20  Yes Eustace Moore, MD    Family History Family History  Problem Relation Age of Onset   Healthy Mother    Cancer Father    Heart attack Father     Social History Social History   Tobacco Use   Smoking status: Former   Smokeless tobacco: Never  Building services engineer Use: Every day   Substances: Nicotine   Devices: Vaping for 4-5 years  Substance Use Topics   Alcohol use: Not Currently   Drug use: Not Currently     Allergies   Penicillins   Review of Systems Review of Systems See HPI  Physical Exam Triage Vital Signs ED  Triage Vitals  Enc Vitals Group     BP 09/12/20 1039 116/75     Pulse Rate 09/12/20 1039 77     Resp 09/12/20 1039 18     Temp 09/12/20 1039 98.8 F (37.1 C)     Temp Source 09/12/20 1039 Oral     SpO2 09/12/20 1039 97 %     Weight 09/12/20 1041 220 lb (99.8 kg)     Height 09/12/20 1041 6\' 3"  (1.905 m)     Head Circumference --      Peak Flow --      Pain Score 09/12/20 1040 1     Pain Loc --      Pain Edu? --      Excl. in GC? --    No data found.  Updated Vital Signs BP 116/75 (BP Location: Right Arm)   Pulse 77   Temp 98.8 F (37.1 C) (Oral)   Resp 18   Ht 6\' 3"  (1.905 m)   Wt 99.8 kg   SpO2 97%   BMI 27.50 kg/m     Physical Exam Constitutional:      General: He is not in acute distress.    Appearance: He is well-developed.  HENT:  Head: Normocephalic and atraumatic.     Mouth/Throat:     Comments: Mask is in place Eyes:     Conjunctiva/sclera: Conjunctivae normal.     Pupils: Pupils are equal, round, and reactive to light.   Cardiovascular:     Rate and Rhythm: Normal rate.  Pulmonary:     Effort: Pulmonary effort is normal. No respiratory distress.  Abdominal:     General: There is no distension.     Palpations: Abdomen is soft.  Musculoskeletal:        General: Normal range of motion.     Cervical back: Normal range of motion.  Skin:    General: Skin is warm and dry.  Neurological:     Mental Status: He is alert.     UC Treatments / Results  Labs (all labs ordered are listed, but only abnormal results are displayed) Labs Reviewed - No data to display  EKG   Radiology No results found.  Procedures Procedures (including critical care time)  Medications Ordered in UC Medications - No data to display  Initial Impression / Assessment and Plan / UC Course  I have reviewed the triage vital signs and the nursing notes.  Pertinent labs & imaging results that were available during my care of the patient were reviewed by me and considered  in my medical decision making (see chart for details).     This looks like an allergic reaction of some sort.  Lid everted and no foreign body seen.  No conjunctival injection.  We will treat with cool compresses and prednisone.  Expect resolution in a day or 2 Final Clinical Impressions(s) / UC Diagnoses   Final diagnoses:  Eye irritation     Discharge Instructions      Use cool compresses to eye Take antihistamines like Claritin or Zyrtec, may take Benadryl at night Take the prednisone 2 times a day, take 2 doses today.  Take prednisone until reaction clears, likely 2 to 3 days Return as needed   ED Prescriptions     Medication Sig Dispense Auth. Provider   predniSONE (DELTASONE) 20 MG tablet Take 1 tablet (20 mg total) by mouth 2 (two) times daily with a meal. 10 tablet Eustace Moore, MD      PDMP not reviewed this encounter.   Eustace Moore, MD 09/13/20 [phone]

## 2020-10-05 ENCOUNTER — Telehealth: Payer: Self-pay

## 2020-10-05 ENCOUNTER — Emergency Department (INDEPENDENT_AMBULATORY_CARE_PROVIDER_SITE_OTHER)
Admission: EM | Admit: 2020-10-05 | Discharge: 2020-10-05 | Disposition: A | Discharge status: Home / Self Care | Payer: Managed Care, Other (non HMO) | Source: Home / Self Care | Attending: Family Medicine | Admitting: Family Medicine

## 2020-10-05 DIAGNOSIS — R197 Diarrhea, unspecified: Secondary | ICD-10-CM

## 2020-10-05 DIAGNOSIS — L500 Allergic urticaria: Secondary | ICD-10-CM

## 2020-10-05 MED ORDER — METHYLPREDNISOLONE 4 MG PO TBPK: ORAL_TABLET | ORAL | 0 refills | Status: DC

## 2020-10-05 NOTE — Telephone Encounter (Signed)
Pt calls to report that the rash he was seen here for today has now spread from his feet to all over his body. Denies oral swelling or difficulty breathing. Discussed w/ Dr. Delton See. New order received. Pt notified of Rx.

## 2020-10-05 NOTE — ED Provider Notes (Signed)
[name]    CSN: [id number] Arrival date & time: 10/05/20  1131      History   Chief Complaint Chief Complaint  Patient presents with   Rash    HPI Daniel Goodwin is a 30 y.o. male.   HPI  Patient is quick to explain that he has a significant anxiety disorder.  He states it is currently worse over the last 2 to 3 years.  He has a rash on his ankles.  Is getting larger.  Started today.  He did have some itching yesterday but did not notice any rash.  The rash looks like urticaria.  It is localized around his ankles.  He states that he does not have any new soaps lotions powders or products.  No new laundry soap.  He states he does not have sensitive skin.  He is not allergic to anything that he knows of.  He did try a new supplement called katom last week but this caused him to have diarrhea and no rash.  He only took it once.  It was supposed to help with his anxiety.  He has not taken any antihistamines for the itching. He is concerned about the diarrhea.  He states his father was just diagnosed with pancreatic cancer that is inoperable.  He is quite upset about this.  He now worries that he might have cancer.  He looks up a lot of his symptoms on the Internet.  He wants me to test him for cancer.  I told him with a week of diarrhea it is appropriate to check his electrolytes and CBC, however these will not tell us whether he has or is prone to cancer.  I spent some time discussing the benefits of having a primary care doctor to help with his anxiety and concerns.  Past Medical History:  Diagnosis Date   ADHD    Anxiety    COVID     There are no problems to display for this patient.   History reviewed. No pertinent surgical history.     Home Medications    Prior to Admission medications   Medication Sig Start Date End Date Taking? Authorizing Provider  acetaminophen (TYLENOL) 325 MG tablet Take 650 mg by mouth every 6 (six) hours as needed.    [provider]  ALPRAZolam Prudy Feeler) 1 MG tablet Take by mouth. 07/14/14   [provider]  amphetamine-dextroamphetamine (ADDERALL XR) 10 MG 24 hr capsule Take by mouth.    [provider]    Family History Family History  Problem Relation Age of Onset   Healthy Mother    Cancer Father    Heart attack Father     Social History Social History   Tobacco Use   Smoking status: Former   Smokeless tobacco: Never  Building services engineer Use: Every day   Substances: Nicotine   Devices: Vaping for 4-5 years  Substance Use Topics   Alcohol use: Not Currently   Drug use: Not Currently     Allergies   Penicillins   Review of Systems Review of Systems See HPI  Physical Exam Triage Vital Signs ED Triage Vitals  Enc Vitals Group     BP 10/05/20 1147 127/84     Pulse Rate 10/05/20 1147 93     Resp 10/05/20 1147 20     Temp 10/05/20 1147 99.2 F (37.3 C)     Temp Source 10/05/20 1147 Oral     SpO2 10/05/20  1147 96 %     Weight 10/05/20 1144 225 lb (102.1 kg)     Height 10/05/20 1144 6\' 3"  (1.905 m)     Head Circumference --      Peak Flow --      Pain Score 10/05/20 1144 0     Pain Loc --      Pain Edu? --      Excl. in GC? --    No data found.  Updated Vital Signs BP 127/84 (BP Location: Right Arm)   Pulse 93   Temp 99.2 F (37.3 C) (Oral)   Resp 20   Ht 6\' 3"  (1.905 m)   Wt 102.1 kg   SpO2 96%   BMI 28.12 kg/m      Physical Exam Constitutional:      General: He is not in acute distress.    Appearance: Normal appearance. He is well-developed.     Comments: Mild anxiety is apparent  HENT:     Head: Normocephalic and atraumatic.     Mouth/Throat:     Comments: Mask is in place Eyes:     Conjunctiva/sclera: Conjunctivae normal.     Pupils: Pupils are equal, round, and reactive to light.  Cardiovascular:     Rate and Rhythm: Normal rate and regular rhythm.  Pulmonary:     Effort: Pulmonary effort is normal. No respiratory distress.      Breath sounds: Normal breath sounds. No wheezing or rales.  Abdominal:     General: Bowel sounds are normal. There is no distension.     Palpations: Abdomen is soft.     Tenderness: There is no abdominal tenderness.  Musculoskeletal:        General: Normal range of motion.     Cervical back: Normal range of motion.  Skin:    General: Skin is warm and dry.     Comments: Irregular urticarial wheals around both ankles left greater than right  Neurological:     General: No focal deficit present.     Mental Status: He is alert.     Gait: Gait normal.  Psychiatric:        Behavior: Behavior normal.     Comments: Mild anxiety     UC Treatments / Results  Labs (all labs ordered are listed, but only abnormal results are displayed) Labs Reviewed  COMPLETE METABOLIC PANEL WITH GFR  CBC WITH DIFFERENTIAL/PLATELET  LIPASE    EKG   Radiology No results found.  Procedures Procedures (including critical care time)  Medications Ordered in UC Medications - No data to display  Initial Impression / Assessment and Plan / UC Course  I have reviewed the triage vital signs and the nursing notes.  Pertinent labs & imaging results that were available during my care of the patient were reviewed by me and considered in my medical decision making (see chart for details).     Patient agrees to go to primary care for follow-up.  I am getting a blood work to make sure that the weeks worth of diarrhea has not caused any electrolyte changes, to check his liver and kidneys, and we will get a CBC. He is told to use antihistamines for the urticaria.  There is not enough skin area involved to warrant steroids Final Clinical Impressions(s) / UC Diagnoses   Final diagnoses:  Diarrhea, unspecified type  Allergic urticaria     Discharge Instructions      Take antihistamines for the rash Try cetirizine in the  morning ( zyrtec generic) and benadryl at night  Check my chart for test results You  will be called if any tests are abnormal  Follow up with primary care   ED Prescriptions   None    PDMP not reviewed this encounter.   Eustace Moore, MD 10/05/20 1310

## 2020-10-05 NOTE — Discharge Instructions (Signed)
Take antihistamines for the rash Try cetirizine in the morning ( zyrtec generic) and benadryl at night  Check my chart for test results You will be called if any tests are abnormal  Follow up with primary care

## 2020-10-05 NOTE — ED Triage Notes (Signed)
Pt presents to Urgent Care with c/o rash to feet since this AM. Also states he noticed itching to his hips last night, but does not have a rash there. States he has recently changed detergents.

## 2020-10-06 LAB — CBC WITH DIFFERENTIAL/PLATELET
Absolute Monocytes: 491 cells/uL (ref 200–950)
Basophils Absolute: 39 cells/uL (ref 0–200)
Basophils Relative: 0.5 %
Eosinophils Absolute: 109 cells/uL (ref 15–500)
Eosinophils Relative: 1.4 %
HCT: 45.4 % (ref 38.5–50.0)
Hemoglobin: 15.6 g/dL (ref 13.2–17.1)
Lymphs Abs: 1443 cells/uL (ref 850–3900)
MCH: 30.1 pg (ref 27.0–33.0)
MCHC: 34.4 g/dL (ref 32.0–36.0)
MCV: 87.5 fL (ref 80.0–100.0)
MPV: 10.2 fL (ref 7.5–12.5)
Monocytes Relative: 6.3 %
Neutro Abs: 5717 cells/uL (ref 1500–7800)
Neutrophils Relative %: 73.3 %
Platelets: 296 10*3/uL (ref 140–400)
RBC: 5.19 10*6/uL (ref 4.20–5.80)
RDW: 12.3 % (ref 11.0–15.0)
Total Lymphocyte: 18.5 %
WBC: 7.8 10*3/uL (ref 3.8–10.8)

## 2020-10-06 LAB — COMPLETE METABOLIC PANEL WITH GFR
AG Ratio: 1.8 (calc) (ref 1.0–2.5)
ALT: 106 U/L — ABNORMAL HIGH (ref 9–46)
AST: 52 U/L — ABNORMAL HIGH (ref 10–40)
Albumin: 4.6 g/dL (ref 3.6–5.1)
Alkaline phosphatase (APISO): 56 U/L (ref 36–130)
BUN: 11 mg/dL (ref 7–25)
CO2: 26 mmol/L (ref 20–32)
Calcium: 10 mg/dL (ref 8.6–10.3)
Chloride: 103 mmol/L (ref 98–110)
Creat: 1.07 mg/dL (ref 0.60–1.24)
Globulin: 2.6 g/dL (calc) (ref 1.9–3.7)
Glucose, Bld: 106 mg/dL — ABNORMAL HIGH (ref 65–99)
Potassium: 4.1 mmol/L (ref 3.5–5.3)
Sodium: 138 mmol/L (ref 135–146)
Total Bilirubin: 0.5 mg/dL (ref 0.2–1.2)
Total Protein: 7.2 g/dL (ref 6.1–8.1)
eGFR: 96 mL/min/{1.73_m2} (ref >=60)

## 2020-10-06 LAB — LIPASE: Lipase: 19 U/L (ref 7–60)

## 2020-10-11 ENCOUNTER — Telehealth: Payer: Self-pay | Admitting: Emergency Medicine

## 2020-10-11 NOTE — Telephone Encounter (Signed)
Patient called regarding his lab results.  Advised that his glucose was slightly elevated, could be that he was not fasting for labs.  Patient will get established with a PCP to follow up regarding lab work.  Patient voices understanding.

## 2020-11-12 ENCOUNTER — Other Ambulatory Visit: Payer: Self-pay

## 2020-11-12 ENCOUNTER — Emergency Department (INDEPENDENT_AMBULATORY_CARE_PROVIDER_SITE_OTHER)
Admission: EM | Admit: 2020-11-12 | Discharge: 2020-11-12 | Disposition: A | Discharge status: Home / Self Care | Payer: Self-pay | Source: Home / Self Care | Attending: Family Medicine | Admitting: Family Medicine

## 2020-11-12 DIAGNOSIS — J029 Acute pharyngitis, unspecified: Secondary | ICD-10-CM

## 2020-11-12 LAB — POCT RAPID STREP A (OFFICE): Rapid Strep A Screen: NEGATIVE

## 2020-11-12 NOTE — ED Triage Notes (Signed)
Pt states that he has a sore throat, headache, and body aches. X6 days Pt states that he has some chest pain as well. Pt states that his is vaccinated.

## 2020-11-12 NOTE — Discharge Instructions (Signed)
The rapid strep is negative.  Antibiotics will not help May use Tylenol or ibuprofen for pain and fever.  Salt water gargles.  Chloraseptic spray or lozenges  Swab has been done to check for RSV, influenza AMB, and to confirm you do not have COVID.  Check for these results in MyChart  Off work until Monday.  I still recommend that you see a primary care physician

## 2020-11-12 NOTE — ED Provider Notes (Signed)
[facility]    CSN: [id number] Arrival date & time: 11/12/20  1417      History   Chief Complaint Chief Complaint  Patient presents with   Sore Throat    Pt states that he has a sore throat, body aches and headache. X6 days    HPI Daniel Goodwin is a 30 y.o. male.   HPI Patient states that on Friday he had the sudden onset of fever chills body aches and exhaustion.  He also had a headache.  He states that this caused him to feel very tired and sick for a couple of days.  Then he developed a sore throat.  He states he continues to have a sore throat, headache, and body ache for 6 days.  He went to the pharmacy.  Had a COVID test done.  This was negative.  He is here because he continues to be sick and is worried that he is going to give what ever illness he has to his 15-month-old son.  He would like additional testing.  Past Medical History:  Diagnosis Date   ADHD    Anxiety    COVID     There are no problems to display for this patient.   History reviewed. No pertinent surgical history.     Home Medications    Prior to Admission medications   Medication Sig Start Date End Date Taking? Authorizing Provider  ALPRAZolam Prudy Feeler) 1 MG tablet Take by mouth. 07/14/14  Yes [provider]  amphetamine-dextroamphetamine (ADDERALL XR) 10 MG 24 hr capsule Take by mouth.   Yes [provider]  acetaminophen (TYLENOL) 325 MG tablet Take 650 mg by mouth every 6 (six) hours as needed.    [provider]  methylPREDNISolone (MEDROL DOSEPAK) 4 MG TBPK tablet Take as directed w/ food. 10/05/20   Eustace Moore, MD  methylPREDNISolone (MEDROL DOSEPAK) 4 MG TBPK tablet tad 10/05/20   Eustace Moore, MD    Family History Family History  Problem Relation Age of Onset   Healthy Mother    Cancer Father    Heart attack Father     Social History Social History   Tobacco Use   Smoking status: Former   Smokeless tobacco: Never   Building services engineer Use: Every day   Substances: Nicotine   Devices: Vaping for 4-5 years  Substance Use Topics   Alcohol use: Not Currently   Drug use: Not Currently     Allergies   Penicillins   Review of Systems Review of Systems See HPI  Physical Exam Triage Vital Signs ED Triage Vitals  Enc Vitals Group     BP 11/12/20 1441 127/84     Pulse --      Resp 11/12/20 1441 18     Temp 11/12/20 1441 98.5 F (36.9 C)     Temp Source 11/12/20 1441 Oral     SpO2 11/12/20 1441 97 %     Weight 11/12/20 1439 220 lb (99.8 kg)     Height 11/12/20 1439 6\' 3"  (1.905 m)     Head Circumference --      Peak Flow --      Pain Score 11/12/20 1438 4     Pain Loc --      Pain Edu? --      Excl. in GC? --    No data found.  Updated Vital Signs BP 127/84 (BP Location: Right Arm)   Temp 98.5 F (36.9 C) (  Oral)   Resp 18   Ht 6\' 3"  (1.905 m)   Wt 99.8 kg   SpO2 97%   BMI 27.50 kg/m      Physical Exam Constitutional:      General: He is not in acute distress.    Appearance: He is well-developed.  HENT:     Head: Normocephalic and atraumatic.     Right Ear: Tympanic membrane and ear canal normal.     Left Ear: Tympanic membrane and ear canal normal.     Nose: No congestion.     Mouth/Throat:     Mouth: Mucous membranes are moist.     Pharynx: Uvula midline. Posterior oropharyngeal erythema present.     Tonsils: No tonsillar exudate or tonsillar abscesses. 2+ on the right. 2+ on the left.     Comments: Some lymphoid hyperplasia across the posterior pharynx.  Minimal tonsillar swelling and erythema.  There is no exudate but tonsillar stones are visible. Eyes:     Conjunctiva/sclera: Conjunctivae normal.     Pupils: Pupils are equal, round, and reactive to light.  Cardiovascular:     Rate and Rhythm: Normal rate and regular rhythm.     Heart sounds: Normal heart sounds.  Pulmonary:     Effort: Pulmonary effort is normal. No respiratory distress.     Breath sounds:  Normal breath sounds.  Abdominal:     General: There is no distension.     Palpations: Abdomen is soft.  Musculoskeletal:        General: Normal range of motion.     Cervical back: Normal range of motion.  Lymphadenopathy:     Cervical: Cervical adenopathy present.  Skin:    General: Skin is warm and dry.  Neurological:     Mental Status: He is alert.     UC Treatments / Results  Labs (all labs ordered are listed, but only abnormal results are displayed) Labs Reviewed  COVID-19, FLU A+B AND RSV  CULTURE, GROUP A STREP  POCT RAPID STREP A (OFFICE)    EKG   Radiology No results found.  Procedures Procedures (including critical care time)  Medications Ordered in UC Medications - No data to display  Initial Impression / Assessment and Plan / UC Course  I have reviewed the triage vital signs and the nursing notes.  Pertinent labs & imaging results that were available during my care of the patient were reviewed by me and considered in my medical decision making (see chart for details).     Discussed with patient this is likely a viral illness.  Recommend symptomatic care.  We will test for flu COVID and RSV to predict contagiousness to infant.  He may return to work on Monday Final Clinical Impressions(s) / UC Diagnoses   Final diagnoses:  Acute pharyngitis, unspecified etiology     Discharge Instructions      The rapid strep is negative.  Antibiotics will not help May use Tylenol or ibuprofen for pain and fever.  Salt water gargles.  Chloraseptic spray or lozenges  Swab has been done to check for RSV, influenza AMB, and to confirm you do not have COVID.  Check for these results in MyChart  Off work until Monday.  I still recommend that you see a primary care physician   ED Prescriptions   None    PDMP not reviewed this encounter.   Sunday, MD 11/12/20 [phone]

## 2020-11-14 LAB — COVID-19, FLU A+B AND RSV
Influenza A, NAA: NOT DETECTED
Influenza B, NAA: NOT DETECTED
RSV, NAA: NOT DETECTED
SARS-CoV-2, NAA: NOT DETECTED

## 2020-11-14 LAB — SPECIMEN STATUS REPORT

## 2020-11-16 LAB — SPECIMEN STATUS REPORT

## 2020-11-21 ENCOUNTER — Telehealth: Payer: Self-pay | Admitting: Emergency Medicine

## 2020-11-21 MED ORDER — AZITHROMYCIN 250 MG PO TABS: 250.0000 mg | ORAL_TABLET | Freq: Every day | ORAL | 0 refills | Status: DC

## 2020-11-21 NOTE — Telephone Encounter (Signed)
Patient complains of continued sore throat in spite of symptomatic treatment for the last week plus.  His COVID and influenza test were negative.  I will give an antibiotic to see if this is helpful.  He is allergic to penicillin.  I will send in a Z-Pak.  If he fails to improve he really needs to see a PCP, or perhaps an ENT

## 2020-11-21 NOTE — Telephone Encounter (Signed)
Call from [facility] regarding ongoing symptoms - no improvement - COVID/Flu/RSV all negative - send out strep culture not completed. Pt asking if he needs to be seen again or can medicine be sent in for him

## 2020-11-21 NOTE — Telephone Encounter (Addendum)
RN attempted to call pt to let him know that an antibiotic had been sent in to pharmacy on record. Phone rings busy. Unable to leave a message. Pt to pick up medicine and start today. Finish all of the z-pak, follow up w/ your PCP if you do not improve.  Second call to [facility] regarding antibiotic sent in for him to start today

## 2020-11-24 ENCOUNTER — Other Ambulatory Visit: Payer: Self-pay

## 2020-11-24 ENCOUNTER — Emergency Department (INDEPENDENT_AMBULATORY_CARE_PROVIDER_SITE_OTHER)
Admission: EM | Admit: 2020-11-24 | Discharge: 2020-11-24 | Disposition: A | Discharge status: Home / Self Care | Payer: Self-pay | Source: Home / Self Care

## 2020-11-24 DIAGNOSIS — J029 Acute pharyngitis, unspecified: Secondary | ICD-10-CM

## 2020-11-24 NOTE — ED Provider Notes (Signed)
[facility]    CSN: [id number] Arrival date & time: 11/24/20  1706      History   Chief Complaint Chief Complaint  Patient presents with   Sore Throat   Hoarse    HPI Yerick Eggebrecht is a 30 y.o. male.   HPI 30 year old male presents with sore throat since 11/12/2020.  Patient was initially evaluated here for acute pharyngitis.  Patient reports difficulty swallowing and now has lost his voice.  Past Medical History:  Diagnosis Date   ADHD    Anxiety    COVID     There are no problems to display for this patient.   History reviewed. No pertinent surgical history.     Home Medications    Prior to Admission medications   Medication Sig Start Date End Date Taking? Authorizing Provider  acetaminophen (TYLENOL) 325 MG tablet Take 650 mg by mouth every 6 (six) hours as needed.    [provider]  ALPRAZolam Prudy Feeler) 1 MG tablet Take by mouth. 07/14/14   [provider]  amphetamine-dextroamphetamine (ADDERALL XR) 10 MG 24 hr capsule Take by mouth.    [provider]  azithromycin (ZITHROMAX) 250 MG tablet Take 1 tablet (250 mg total) by mouth daily. Take first 2 tablets together, then 1 every day until finished. 11/21/20   Eustace Moore, MD  methylPREDNISolone (MEDROL DOSEPAK) 4 MG TBPK tablet Take as directed w/ food. 10/05/20   Eustace Moore, MD  methylPREDNISolone (MEDROL DOSEPAK) 4 MG TBPK tablet tad 10/05/20   Eustace Moore, MD    Family History Family History  Problem Relation Age of Onset   Healthy Mother    Cancer Father    Heart attack Father     Social History Social History   Tobacco Use   Smoking status: Former   Smokeless tobacco: Never  Building services engineer Use: Every day   Substances: Nicotine   Devices: Vaping for 4-5 years  Substance Use Topics   Alcohol use: Not Currently   Drug use: Not Currently     Allergies   Penicillins   Review of Systems Review of Systems  HENT:   Positive for sore throat.   All other systems reviewed and are negative.   Physical Exam Triage Vital Signs ED Triage Vitals [11/24/20 1733]  Enc Vitals Group     BP      Pulse      Resp      Temp      Temp src      SpO2      Weight      Height      Head Circumference      Peak Flow      Pain Score 7     Pain Loc      Pain Edu?      Excl. in GC?    No data found.  Updated Vital Signs There were no vitals taken for this visit.  Physical Exam Vitals and nursing note reviewed.  Constitutional:      Appearance: He is well-developed.  HENT:     Head: Normocephalic and atraumatic.     Right Ear: Tympanic membrane and ear canal normal.     Left Ear: Tympanic membrane and ear canal normal.     Mouth/Throat:     Mouth: Mucous membranes are moist.     Pharynx: Oropharynx is clear. Uvula midline.  Eyes:     Conjunctiva/sclera: Conjunctivae normal.  Pupils: Pupils are equal, round, and reactive to light.  Cardiovascular:     Rate and Rhythm: Normal rate and regular rhythm.     Heart sounds: Normal heart sounds.  Pulmonary:     Effort: Pulmonary effort is normal.     Breath sounds: Normal breath sounds.  Musculoskeletal:     Cervical back: Normal range of motion and neck supple.  Skin:    General: Skin is warm and dry.  Neurological:     General: No focal deficit present.     Mental Status: He is alert and oriented to person, place, and time.     UC Treatments / Results  Labs (all labs ordered are listed, but only abnormal results are displayed) Labs Reviewed - No data to display  EKG   Radiology No results found.  Procedures Procedures (including critical care time)  Medications Ordered in UC Medications - No data to display  Initial Impression / Assessment and Plan / UC Course  I have reviewed the triage vital signs and the nursing notes.  Pertinent labs & imaging results that were available during my care of the patient were reviewed by me and  considered in my medical decision making (see chart for details).     MDM: 1.  Acute pharyngitis-Zithromax called into patient's pharmacy this past weekend (11/21/2020). Advised patient that Z-Pak was sent to his pharmacy on 11/21/2020 and to pickup and take medication now.  Patient discharged home, hemodynamically stable. Final Clinical Impressions(s) / UC Diagnoses   Final diagnoses:  Acute pharyngitis, unspecified etiology     Discharge Instructions      Advised patient that Z-Pak was sent to his pharmacy on 11/21/2020 and to pickup and take medication now.     ED Prescriptions   None    PDMP not reviewed this encounter.   Trevor Iha, FNP 11/24/20 1755

## 2020-11-24 NOTE — ED Triage Notes (Signed)
Pt c/o sore throat and cough x 3 days. Losing voice in the last couple days. Tylenol, motrin and lozenges prn. Denies fever. Keeping him up at night.

## 2020-11-24 NOTE — Discharge Instructions (Addendum)
Advised patient that Z-Pak was sent to his pharmacy on 11/21/2020 and to pickup and take medication now.

## 2020-11-26 ENCOUNTER — Telehealth: Payer: Self-pay

## 2020-11-26 NOTE — Telephone Encounter (Signed)
Pt notified of provider recommendations and to follow up with ENT if no relief

## 2021-03-23 ENCOUNTER — Other Ambulatory Visit: Payer: Self-pay

## 2021-03-23 ENCOUNTER — Encounter: Payer: Self-pay | Admitting: Emergency Medicine

## 2021-03-23 ENCOUNTER — Emergency Department (INDEPENDENT_AMBULATORY_CARE_PROVIDER_SITE_OTHER)
Admission: EM | Admit: 2021-03-23 | Discharge: 2021-03-23 | Disposition: A | Discharge status: Home / Self Care | Payer: Managed Care, Other (non HMO) | Source: Home / Self Care | Attending: Family Medicine | Admitting: Family Medicine

## 2021-03-23 DIAGNOSIS — J111 Influenza due to unidentified influenza virus with other respiratory manifestations: Secondary | ICD-10-CM | POA: Diagnosis not present

## 2021-03-23 LAB — POC INFLUENZA A AND B ANTIGEN (URGENT CARE ONLY)
Influenza A Ag: NEGATIVE
Influenza B Ag: NEGATIVE

## 2021-03-23 NOTE — Discharge Instructions (Addendum)
Rest and push fluids Take over-the-counter cough and cold medicine as needed Tylenol or ibuprofen for pain and fever Consider probiotic Gummies daily for a month

## 2021-03-23 NOTE — ED Provider Notes (Signed)
[facility]    CSN: [id number] Arrival date & time: 03/23/21  1609      History   Chief Complaint Chief Complaint  Patient presents with   Cough    HPI Daniel Goodwin is a 31 y.o. male.   HPI  Patient states he had a mild sore throat runny nose for 2 days, then last night at 3 in the morning awakened with shaking chills.  He states his temperature was 103.  He took Tylenol went back to sleep.  Today he has had body aches fever off and on.  Still has a sore throat.  Mild runny nose with no cough.  He did a home COVID test and it was negative.  He is here requesting a flu test  Past Medical History:  Diagnosis Date   ADHD    Anxiety    COVID     There are no problems to display for this patient.   History reviewed. No pertinent surgical history.     Home Medications    Prior to Admission medications   Medication Sig Start Date End Date Taking? Authorizing Provider  acetaminophen (TYLENOL) 325 MG tablet Take 650 mg by mouth every 6 (six) hours as needed.    [provider]  ALPRAZolam Prudy Feeler) 1 MG tablet Take by mouth. 07/14/14   [provider]  amphetamine-dextroamphetamine (ADDERALL XR) 10 MG 24 hr capsule Take by mouth.    [provider]    Family History Family History  Problem Relation Age of Onset   Healthy Mother    Cancer Father    Heart attack Father     Social History Social History   Tobacco Use   Smoking status: Former   Smokeless tobacco: Never  Building services engineer Use: Every day   Substances: Nicotine   Devices: Vaping for 4-5 years  Substance Use Topics   Alcohol use: Not Currently   Drug use: Not Currently     Allergies   Penicillins   Review of Systems Review of Systems See HPI  Physical Exam Triage Vital Signs ED Triage Vitals  Enc Vitals Group     BP 03/23/21 1638 130/88     Pulse Rate 03/23/21 1638 89     Resp 03/23/21 1638 18     Temp 03/23/21 1638 98.2 F (36.8 C)      Temp Source 03/23/21 1638 Oral     SpO2 03/23/21 1638 96 %     Weight --      Height --      Head Circumference --      Peak Flow --      Pain Score 03/23/21 1640 5     Pain Loc --      Pain Edu? --      Excl. in GC? --    No data found.  Updated Vital Signs BP 130/88 (BP Location: Right Arm)    Pulse 89    Temp 98.2 F (36.8 C) (Oral)    Resp 18    SpO2 96%       Physical Exam Constitutional:      General: He is not in acute distress.    Appearance: Normal appearance. He is well-developed and normal weight.  HENT:     Head: Normocephalic and atraumatic.     Right Ear: Tympanic membrane and ear canal normal.     Left Ear: Tympanic membrane and ear canal normal.     Nose: Rhinorrhea present.  Comments: Clear rhinorrhea    Mouth/Throat:     Pharynx: Posterior oropharyngeal erythema present.  Eyes:     Conjunctiva/sclera: Conjunctivae normal.     Pupils: Pupils are equal, round, and reactive to light.  Cardiovascular:     Rate and Rhythm: Normal rate and regular rhythm.     Heart sounds: Normal heart sounds.  Pulmonary:     Effort: Pulmonary effort is normal. No respiratory distress.     Breath sounds: Normal breath sounds.  Abdominal:     General: There is no distension.     Palpations: Abdomen is soft.  Musculoskeletal:        General: Normal range of motion.     Cervical back: Normal range of motion.  Skin:    General: Skin is warm and dry.  Neurological:     Mental Status: He is alert.     UC Treatments / Results  Labs (all labs ordered are listed, but only abnormal results are displayed) Labs Reviewed  POC INFLUENZA A AND B ANTIGEN (URGENT CARE ONLY) - Normal    EKG   Radiology No results found.  Procedures Procedures (including critical care time)  Medications Ordered in UC Medications - No data to display  Initial Impression / Assessment and Plan / UC Course  I have reviewed the triage vital signs and the nursing notes.  Pertinent labs  & imaging results that were available during my care of the patient were reviewed by me and considered in my medical decision making (see chart for details).     Flu test is negative.  Patient is concerned he has diminished immunity after recovering from COVID.  Offered CBC.  He refuses. Final Clinical Impressions(s) / UC Diagnoses   Final diagnoses:  Influenza-like illness     Discharge Instructions        Rest and push fluids Take over-the-counter cough and cold medicine as needed Tylenol or ibuprofen for pain and fever Consider probiotic Gummies daily for a month   ED Prescriptions   None    PDMP not reviewed this encounter.   Eustace Moore, MD 03/23/21 [phone]

## 2021-03-23 NOTE — ED Triage Notes (Addendum)
Pt states yesterday he developed sudden onset of fever 103, cough, headache and body aches. States last med tylenol at 3pm. [facility] test but he is requesting a flu test.

## 2021-07-03 IMAGING — DX DG TOE 4TH 2+V*L*
3 series · 3 of 3 positions shown · non-contrast
Comparison: None.

CLINICAL DATA: Pain after hitting toe against solid object

EXAM:
LEFT FOURTH TOE: 3 V

[toe ap]
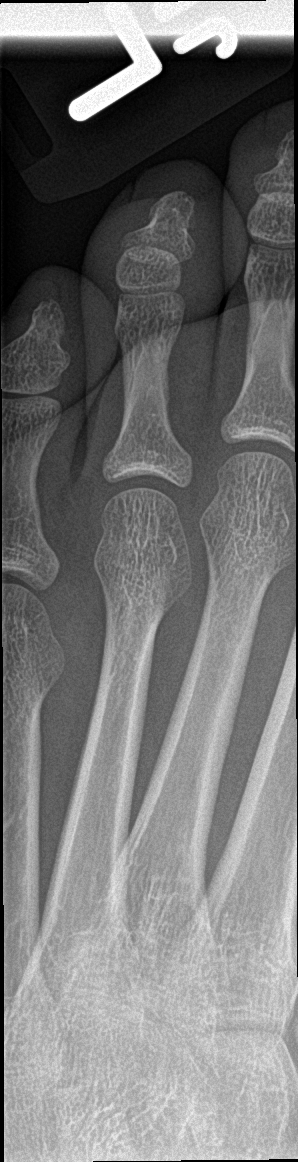

[toe obl]
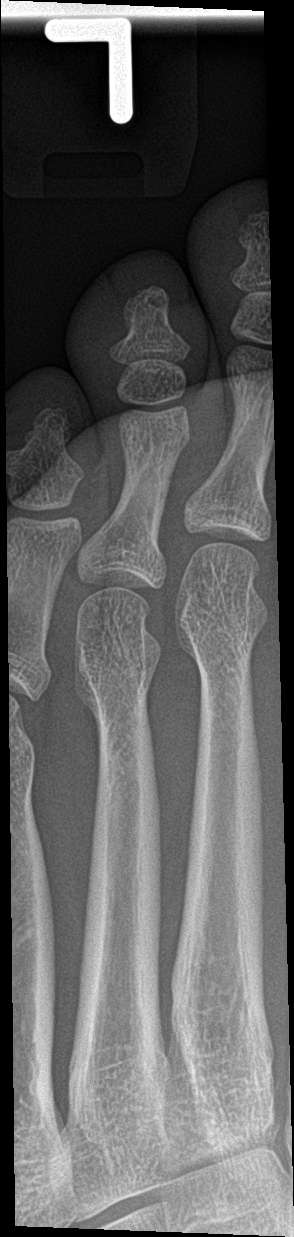

[toe lat]
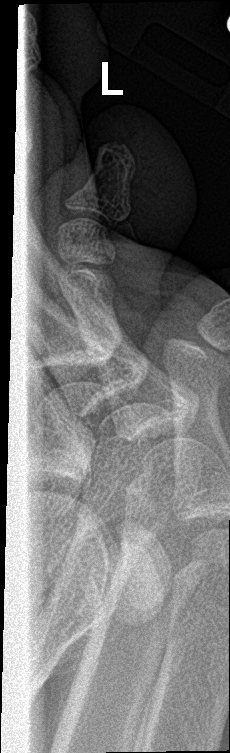

[3 of 3 positions shown; findings below may reference images not displayed]

FINDINGS: Frontal, oblique, and lateral views were obtained. There is no
fracture or dislocation. Joint spaces appear normal. No erosive
change.
IMPRESSION: No fracture or dislocation.  No appreciable arthropathy.

## 2021-09-01 ENCOUNTER — Ambulatory Visit: Payer: Self-pay
